# Patient Record
Sex: Male | Born: 1995 | Race: White | Hispanic: No | Marital: Single | Smoking: Never smoker
Health system: Southern US, Community
[De-identification: ages and names within clinical notes are randomized; demographics above are authoritative.]

## PROBLEM LIST (undated history)

## (undated) DIAGNOSIS — R41840 Attention and concentration deficit: Secondary | ICD-10-CM

---
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
## 2019-10-10 ENCOUNTER — Ambulatory Visit: Payer: Self-pay | Attending: Internal Medicine

## 2019-10-10 DIAGNOSIS — Z23 Encounter for immunization: Secondary | ICD-10-CM

## 2019-10-10 NOTE — Progress Notes (Signed)
   Covid-19 Vaccination Clinic  Name:  Elzy Tomasello    MRN: [id number] DOB: 03/23/1996  10/10/2019  Mr. [place] was observed post Covid-19 immunization for 15 minutes without incident. He was provided with Vaccine Information Sheet and instruction to access the V-Safe system.   Mr. Terrones was instructed to call 911 with any severe reactions post vaccine: Marland Kitchen Difficulty breathing  . Swelling of face and throat  . A fast heartbeat  . A bad rash all over body  . Dizziness and weakness   Immunizations Administered    Name Date Dose VIS Date Route   Pfizer COVID-19 Vaccine 10/10/2019  4:10 PM 0.3 mL 06/12/2019 Intramuscular   Manufacturer: [employer], [employer]   Lot: [id number]   NDC: [address]-6

## 2019-11-02 ENCOUNTER — Ambulatory Visit: Payer: Self-pay | Attending: Internal Medicine

## 2019-11-02 DIAGNOSIS — Z23 Encounter for immunization: Secondary | ICD-10-CM

## 2019-11-02 NOTE — Progress Notes (Signed)
   Covid-19 Vaccination Clinic  Name:  Cody Mendez    MRN: [id number] DOB: [birth year]  11/02/2019  Cody Mendez was observed post Covid-19 immunization for 30 minutes based on pre-vaccination screening without incident. He was provided with Vaccine Information Sheet and instruction to access the V-Safe system.   Cody Mendez was instructed to call 911 with any severe reactions post vaccine: Marland Kitchen Difficulty breathing  . Swelling of face and throat  . A fast heartbeat  . A bad rash all over body  . Dizziness and weakness   Immunizations Administered    Name Date Dose VIS Date Route   Pfizer COVID-19 Vaccine 11/02/2019  1:36 PM 0.3 mL 08/26/2018 Intramuscular   Manufacturer: [employer], [employer]   Lot: [id number]   NDC: [address]-7

## 2024-05-06 ENCOUNTER — Other Ambulatory Visit: Payer: Self-pay | Admitting: Student

## 2024-05-06 DIAGNOSIS — R109 Unspecified abdominal pain: Secondary | ICD-10-CM

## 2024-05-15 ENCOUNTER — Ambulatory Visit
Admission: RE | Admit: 2024-05-15 | Discharge: 2024-05-15 | Disposition: A | Discharge status: Home / Self Care | Source: Ambulatory Visit | Attending: Student | Admitting: Student

## 2024-05-15 DIAGNOSIS — R109 Unspecified abdominal pain: Secondary | ICD-10-CM
